# Patient Record
Sex: Female | Born: 1995 | Race: White | Hispanic: No | Marital: Single | Smoking: Never smoker
Health system: Southern US, Community
[De-identification: ages and names within clinical notes are randomized; demographics above are authoritative.]

## PROBLEM LIST (undated history)

## (undated) DIAGNOSIS — K449 Diaphragmatic hernia without obstruction or gangrene: Secondary | ICD-10-CM

## (undated) DIAGNOSIS — K219 Gastro-esophageal reflux disease without esophagitis: Secondary | ICD-10-CM

## (undated) DIAGNOSIS — F819 Developmental disorder of scholastic skills, unspecified: Secondary | ICD-10-CM

## (undated) DIAGNOSIS — F419 Anxiety disorder, unspecified: Secondary | ICD-10-CM

## (undated) HISTORY — PX: WISDOM TOOTH EXTRACTION: SHX21

---

## 2019-07-12 ENCOUNTER — Other Ambulatory Visit: Payer: Self-pay | Admitting: Nurse Practitioner

## 2019-07-12 ENCOUNTER — Other Ambulatory Visit (HOSPITAL_COMMUNITY)
Admission: RE | Admit: 2019-07-12 | Discharge: 2019-07-12 | Disposition: A | Discharge status: Home / Self Care | Payer: Self-pay | Source: Ambulatory Visit | Attending: Nurse Practitioner | Admitting: Nurse Practitioner

## 2019-07-12 DIAGNOSIS — Z01419 Encounter for gynecological examination (general) (routine) without abnormal findings: Secondary | ICD-10-CM | POA: Insufficient documentation

## 2019-07-16 LAB — CYTOLOGY - PAP: Diagnosis: NEGATIVE

## 2020-04-28 ENCOUNTER — Emergency Department (HOSPITAL_COMMUNITY)
Admission: EM | Admit: 2020-04-28 | Discharge: 2020-04-28 | Disposition: A | Discharge status: Home / Self Care | Payer: BC Managed Care – PPO | Attending: Emergency Medicine | Admitting: Emergency Medicine

## 2020-04-28 ENCOUNTER — Encounter (HOSPITAL_COMMUNITY): Payer: Self-pay

## 2020-04-28 ENCOUNTER — Other Ambulatory Visit: Payer: Self-pay

## 2020-04-28 DIAGNOSIS — Z5321 Procedure and treatment not carried out due to patient leaving prior to being seen by health care provider: Secondary | ICD-10-CM | POA: Insufficient documentation

## 2020-04-28 DIAGNOSIS — R0789 Other chest pain: Secondary | ICD-10-CM | POA: Diagnosis present

## 2020-04-28 DIAGNOSIS — R110 Nausea: Secondary | ICD-10-CM | POA: Insufficient documentation

## 2020-04-28 HISTORY — DX: Diaphragmatic hernia without obstruction or gangrene: K44.9

## 2020-04-28 NOTE — ED Triage Notes (Signed)
Arrived POV from home. Patient reports she was diagnosed with hiatal hernia and is having pain associated with it tonight. Patient reports mid-sternal chest pain that goes around to right upper back. Also reports nausea without vomiting.

## 2020-09-14 ENCOUNTER — Other Ambulatory Visit: Payer: Self-pay | Admitting: Gastroenterology

## 2020-09-14 DIAGNOSIS — K2100 Gastro-esophageal reflux disease with esophagitis, without bleeding: Secondary | ICD-10-CM

## 2020-09-14 DIAGNOSIS — R1013 Epigastric pain: Secondary | ICD-10-CM

## 2020-09-24 ENCOUNTER — Ambulatory Visit
Admission: RE | Admit: 2020-09-24 | Discharge: 2020-09-24 | Disposition: A | Discharge status: Home / Self Care | Payer: BC Managed Care – PPO | Source: Ambulatory Visit | Attending: Gastroenterology | Admitting: Gastroenterology

## 2020-09-24 DIAGNOSIS — K2100 Gastro-esophageal reflux disease with esophagitis, without bleeding: Secondary | ICD-10-CM

## 2020-09-24 DIAGNOSIS — R1013 Epigastric pain: Secondary | ICD-10-CM

## 2020-12-17 ENCOUNTER — Other Ambulatory Visit (HOSPITAL_COMMUNITY)
Admission: RE | Admit: 2020-12-17 | Discharge: 2020-12-17 | Disposition: A | Discharge status: Home / Self Care | Payer: BC Managed Care – PPO | Source: Ambulatory Visit | Attending: General Surgery | Admitting: General Surgery

## 2020-12-17 DIAGNOSIS — Z79899 Other long term (current) drug therapy: Secondary | ICD-10-CM | POA: Diagnosis not present

## 2020-12-17 DIAGNOSIS — Z01812 Encounter for preprocedural laboratory examination: Secondary | ICD-10-CM | POA: Insufficient documentation

## 2020-12-17 DIAGNOSIS — Z20822 Contact with and (suspected) exposure to covid-19: Secondary | ICD-10-CM | POA: Insufficient documentation

## 2020-12-17 DIAGNOSIS — K8080 Other cholelithiasis without obstruction: Secondary | ICD-10-CM | POA: Diagnosis present

## 2020-12-17 DIAGNOSIS — K8010 Calculus of gallbladder with chronic cholecystitis without obstruction: Secondary | ICD-10-CM | POA: Diagnosis not present

## 2020-12-18 ENCOUNTER — Other Ambulatory Visit: Payer: Self-pay

## 2020-12-18 ENCOUNTER — Encounter (HOSPITAL_COMMUNITY): Payer: Self-pay | Admitting: General Surgery

## 2020-12-18 LAB — SARS CORONAVIRUS 2 (TAT 6-24 HRS): SARS Coronavirus 2: NEGATIVE

## 2020-12-18 NOTE — Progress Notes (Addendum)
Brandi Leon denies chest pain or shortness of breath. Patient tested negative for Covid_1/3/22_ and has been in quarantine since that time.  I instructed Brandi. Henderson to stop [employer]

## 2020-12-19 ENCOUNTER — Ambulatory Visit: Payer: Self-pay | Admitting: General Surgery

## 2020-12-19 NOTE — Anesthesia Preprocedure Evaluation (Addendum)
Anesthesia Evaluation  Patient identified by MRN, date of birth, ID band Patient awake    Reviewed: Allergy & Precautions, H&P , NPO status , Patient's Chart, lab work & pertinent test results  Airway Mallampati: II  TM Distance: >3 FB Neck ROM: Full    Dental no notable dental hx.    Pulmonary neg pulmonary ROS,    breath sounds clear to auscultation       Cardiovascular Exercise Tolerance: Good negative cardio ROS Normal cardiovascular exam Rhythm:Regular Rate:Normal     Neuro/Psych PSYCHIATRIC DISORDERS Anxiety Hearing loss Learning disability    GI/Hepatic Neg liver ROS, hiatal hernia, GERD  ,  Endo/Other  Morbid obesity  Renal/GU negative Renal ROS  negative genitourinary   Musculoskeletal negative musculoskeletal ROS (+)   Abdominal (+) + obese,   Peds negative pediatric ROS (+)  Hematology negative hematology ROS (+)   Anesthesia Other Findings   Reproductive/Obstetrics negative OB ROS                            Anesthesia Physical Anesthesia Plan  ASA: III  Anesthesia Plan: General   Post-op Pain Management:    Induction: Intravenous  PONV Risk Score and Plan: 2 and Scopolamine patch - Pre-op, Midazolam, Dexamethasone and Ondansetron  Airway Management Planned: Oral ETT  Additional Equipment: None  Intra-op Plan:   Post-operative Plan:   Informed Consent: I have reviewed the patients History and Physical, chart, labs and discussed the procedure including the risks, benefits and alternatives for the proposed anesthesia with the patient or authorized representative who has indicated his/her understanding and acceptance.     Dental advisory given  Plan Discussed with: Anesthesiologist and CRNA  Anesthesia Plan Comments:        Anesthesia Quick Evaluation

## 2020-12-20 ENCOUNTER — Encounter (HOSPITAL_COMMUNITY): Payer: Self-pay | Admitting: General Surgery

## 2020-12-20 ENCOUNTER — Ambulatory Visit (HOSPITAL_COMMUNITY): Payer: BC Managed Care – PPO | Admitting: Anesthesiology

## 2020-12-20 ENCOUNTER — Encounter (HOSPITAL_COMMUNITY)
Admission: RE | Disposition: A | Discharge status: Home / Self Care | Payer: Self-pay | Source: Home / Self Care | Attending: General Surgery

## 2020-12-20 ENCOUNTER — Ambulatory Visit (HOSPITAL_COMMUNITY)
Admission: RE | Admit: 2020-12-20 | Discharge: 2020-12-20 | Disposition: A | Discharge status: Home / Self Care | Payer: BC Managed Care – PPO | Attending: General Surgery | Admitting: General Surgery

## 2020-12-20 ENCOUNTER — Other Ambulatory Visit: Payer: Self-pay

## 2020-12-20 DIAGNOSIS — Z79899 Other long term (current) drug therapy: Secondary | ICD-10-CM | POA: Insufficient documentation

## 2020-12-20 DIAGNOSIS — K8010 Calculus of gallbladder with chronic cholecystitis without obstruction: Secondary | ICD-10-CM | POA: Insufficient documentation

## 2020-12-20 DIAGNOSIS — Z20822 Contact with and (suspected) exposure to covid-19: Secondary | ICD-10-CM | POA: Insufficient documentation

## 2020-12-20 HISTORY — PX: CHOLECYSTECTOMY: SHX55

## 2020-12-20 HISTORY — DX: Developmental disorder of scholastic skills, unspecified: F81.9

## 2020-12-20 HISTORY — DX: Gastro-esophageal reflux disease without esophagitis: K21.9

## 2020-12-20 HISTORY — DX: Anxiety disorder, unspecified: F41.9

## 2020-12-20 LAB — CBC
HCT: 43.1 % (ref 36.0–46.0)
Hemoglobin: 14.3 g/dL (ref 12.0–15.0)
MCH: 28.9 pg (ref 26.0–34.0)
MCHC: 33.2 g/dL (ref 30.0–36.0)
MCV: 87.2 fL (ref 80.0–100.0)
Platelets: 397 10*3/uL (ref 150–400)
RBC: 4.94 MIL/uL (ref 3.87–5.11)
RDW: 13.5 % (ref 11.5–15.5)
WBC: 10.2 10*3/uL (ref 4.0–10.5)
nRBC: 0 % (ref 0.0–0.2)

## 2020-12-20 LAB — POCT PREGNANCY, URINE: Preg Test, Ur: NEGATIVE

## 2020-12-20 SURGERY — LAPAROSCOPIC CHOLECYSTECTOMY
Anesthesia: General | Site: Abdomen

## 2020-12-20 MED ORDER — FENTANYL CITRATE (PF) 100 MCG/2ML IJ SOLN
25.0000 ug | INTRAMUSCULAR | Status: DC | PRN
  Administered 2020-12-20 (×4): 25 ug via INTRAVENOUS

## 2020-12-20 MED ORDER — BUPIVACAINE HCL (PF) 0.25 % IJ SOLN
INTRAMUSCULAR | Status: AC
  Filled 2020-12-20: qty 30

## 2020-12-20 MED ORDER — ONDANSETRON HCL 4 MG/2ML IJ SOLN
INTRAMUSCULAR | Status: AC
  Filled 2020-12-20: qty 2

## 2020-12-20 MED ORDER — ONDANSETRON HCL 4 MG/2ML IJ SOLN
INTRAMUSCULAR | Status: DC | PRN
  Administered 2020-12-20: 4 mg via INTRAVENOUS

## 2020-12-20 MED ORDER — CHLORHEXIDINE GLUCONATE CLOTH 2 % EX PADS: 6.0000 | MEDICATED_PAD | Freq: Once | CUTANEOUS | Status: DC

## 2020-12-20 MED ORDER — ROCURONIUM BROMIDE 10 MG/ML (PF) SYRINGE
PREFILLED_SYRINGE | INTRAVENOUS | Status: DC | PRN
  Administered 2020-12-20: 80 mg via INTRAVENOUS

## 2020-12-20 MED ORDER — LACTATED RINGERS IV SOLN: INTRAVENOUS | Status: DC

## 2020-12-20 MED ORDER — AMISULPRIDE (ANTIEMETIC) 5 MG/2ML IV SOLN: 10.0000 mg | Freq: Once | INTRAVENOUS | Status: DC | PRN

## 2020-12-20 MED ORDER — ENSURE PRE-SURGERY PO LIQD: 296.0000 mL | Freq: Once | ORAL | Status: DC

## 2020-12-20 MED ORDER — SUGAMMADEX SODIUM 200 MG/2ML IV SOLN
INTRAVENOUS | Status: DC | PRN
  Administered 2020-12-20: 400 mg via INTRAVENOUS

## 2020-12-20 MED ORDER — OXYCODONE HCL 5 MG/5ML PO SOLN: 5.0000 mg | Freq: Once | ORAL | Status: DC | PRN

## 2020-12-20 MED ORDER — LIDOCAINE 2% (20 MG/ML) 5 ML SYRINGE
INTRAMUSCULAR | Status: AC
  Filled 2020-12-20: qty 5

## 2020-12-20 MED ORDER — MIDAZOLAM HCL 2 MG/2ML IJ SOLN
INTRAMUSCULAR | Status: AC
  Filled 2020-12-20: qty 2

## 2020-12-20 MED ORDER — SCOPOLAMINE 1 MG/3DAYS TD PT72
1.0000 | MEDICATED_PATCH | TRANSDERMAL | Status: DC
  Administered 2020-12-20: 1.5 mg via TRANSDERMAL
  Filled 2020-12-20: qty 1

## 2020-12-20 MED ORDER — LIDOCAINE 2% (20 MG/ML) 5 ML SYRINGE
INTRAMUSCULAR | Status: DC | PRN
  Administered 2020-12-20: 80 mg via INTRAVENOUS

## 2020-12-20 MED ORDER — OXYCODONE HCL 5 MG PO TABS: 5.0000 mg | ORAL_TABLET | Freq: Once | ORAL | Status: DC | PRN

## 2020-12-20 MED ORDER — MIDAZOLAM HCL 2 MG/2ML IJ SOLN
INTRAMUSCULAR | Status: DC | PRN
  Administered 2020-12-20: 2 mg via INTRAVENOUS

## 2020-12-20 MED ORDER — PROMETHAZINE HCL 25 MG/ML IJ SOLN: 6.2500 mg | INTRAMUSCULAR | Status: DC | PRN

## 2020-12-20 MED ORDER — DEXAMETHASONE SODIUM PHOSPHATE 10 MG/ML IJ SOLN
INTRAMUSCULAR | Status: DC | PRN
  Administered 2020-12-20: 10 mg via INTRAVENOUS

## 2020-12-20 MED ORDER — CHLORHEXIDINE GLUCONATE 0.12 % MT SOLN
15.0000 mL | Freq: Once | OROMUCOSAL | Status: AC
  Administered 2020-12-20: 15 mL via OROMUCOSAL
  Filled 2020-12-20: qty 15

## 2020-12-20 MED ORDER — PHENYLEPHRINE 40 MCG/ML (10ML) SYRINGE FOR IV PUSH (FOR BLOOD PRESSURE SUPPORT)
PREFILLED_SYRINGE | INTRAVENOUS | Status: DC | PRN
  Administered 2020-12-20: 80 ug via INTRAVENOUS

## 2020-12-20 MED ORDER — BUPIVACAINE HCL 0.25 % IJ SOLN
INTRAMUSCULAR | Status: DC | PRN
  Administered 2020-12-20: 16 mL

## 2020-12-20 MED ORDER — PROPOFOL 10 MG/ML IV BOLUS
INTRAVENOUS | Status: DC | PRN
  Administered 2020-12-20: 150 mg via INTRAVENOUS

## 2020-12-20 MED ORDER — 0.9 % SODIUM CHLORIDE (POUR BTL) OPTIME
TOPICAL | Status: DC | PRN
  Administered 2020-12-20: 1000 mL

## 2020-12-20 MED ORDER — ROCURONIUM BROMIDE 10 MG/ML (PF) SYRINGE
PREFILLED_SYRINGE | INTRAVENOUS | Status: AC
  Filled 2020-12-20: qty 10

## 2020-12-20 MED ORDER — ACETAMINOPHEN 500 MG PO TABS
1000.0000 mg | ORAL_TABLET | ORAL | Status: AC
  Administered 2020-12-20: 1000 mg via ORAL
  Filled 2020-12-20: qty 2

## 2020-12-20 MED ORDER — ORAL CARE MOUTH RINSE: 15.0000 mL | Freq: Once | OROMUCOSAL | Status: AC

## 2020-12-20 MED ORDER — FENTANYL CITRATE (PF) 250 MCG/5ML IJ SOLN
INTRAMUSCULAR | Status: AC
  Filled 2020-12-20: qty 5

## 2020-12-20 MED ORDER — CEFAZOLIN SODIUM-DEXTROSE 2-4 GM/100ML-% IV SOLN
2.0000 g | INTRAVENOUS | Status: AC
  Administered 2020-12-20: 2 g via INTRAVENOUS
  Filled 2020-12-20: qty 100

## 2020-12-20 MED ORDER — DEXAMETHASONE SODIUM PHOSPHATE 10 MG/ML IJ SOLN
INTRAMUSCULAR | Status: AC
  Filled 2020-12-20: qty 1

## 2020-12-20 MED ORDER — FENTANYL CITRATE (PF) 250 MCG/5ML IJ SOLN
INTRAMUSCULAR | Status: DC | PRN
  Administered 2020-12-20: 25 ug via INTRAVENOUS
  Administered 2020-12-20 (×3): 50 ug via INTRAVENOUS

## 2020-12-20 MED ORDER — SODIUM CHLORIDE 0.9 % IR SOLN
Status: DC | PRN
  Administered 2020-12-20: 1000 mL

## 2020-12-20 MED ORDER — PROPOFOL 10 MG/ML IV BOLUS
INTRAVENOUS | Status: AC
  Filled 2020-12-20: qty 20

## 2020-12-20 MED ORDER — TRAMADOL HCL 50 MG PO TABS: 50.0000 mg | ORAL_TABLET | Freq: Four times a day (QID) | ORAL | 0 refills | Status: AC | PRN

## 2020-12-20 MED ORDER — FENTANYL CITRATE (PF) 100 MCG/2ML IJ SOLN
INTRAMUSCULAR | Status: AC
  Filled 2020-12-20: qty 2

## 2020-12-20 SURGICAL SUPPLY — 38 items
CANISTER SUCT 3000ML PPV (MISCELLANEOUS) ×2 IMPLANT
CHLORAPREP W/TINT 26 (MISCELLANEOUS) ×2 IMPLANT
CLIP VESOLOCK MED LG 6/CT (CLIP) ×2 IMPLANT
COVER SURGICAL LIGHT HANDLE (MISCELLANEOUS) ×2 IMPLANT
COVER TRANSDUCER ULTRASND (DRAPES) ×2 IMPLANT
COVER WAND RF STERILE (DRAPES) ×2 IMPLANT
DERMABOND ADVANCED (GAUZE/BANDAGES/DRESSINGS) ×1
DERMABOND ADVANCED .7 DNX12 (GAUZE/BANDAGES/DRESSINGS) ×1 IMPLANT
ELECT REM PT RETURN 9FT ADLT (ELECTROSURGICAL) ×2
ELECTRODE REM PT RTRN 9FT ADLT (ELECTROSURGICAL) ×1 IMPLANT
GLOVE BIO SURGEON STRL SZ7.5 (GLOVE) ×2 IMPLANT
GOWN STRL REUS W/ TWL LRG LVL3 (GOWN DISPOSABLE) ×2 IMPLANT
GOWN STRL REUS W/ TWL XL LVL3 (GOWN DISPOSABLE) ×1 IMPLANT
GOWN STRL REUS W/TWL LRG LVL3 (GOWN DISPOSABLE) ×2
GOWN STRL REUS W/TWL XL LVL3 (GOWN DISPOSABLE) ×1
GRASPER SUT TROCAR 14GX15 (MISCELLANEOUS) ×2 IMPLANT
KIT BASIN OR (CUSTOM PROCEDURE TRAY) ×2 IMPLANT
KIT TURNOVER KIT B (KITS) ×2 IMPLANT
NEEDLE INSUFFLATION 14GA 120MM (NEEDLE) ×2 IMPLANT
NS IRRIG 1000ML POUR BTL (IV SOLUTION) ×2 IMPLANT
PAD ARMBOARD 7.5X6 YLW CONV (MISCELLANEOUS) ×2 IMPLANT
POUCH LAPAROSCOPIC INSTRUMENT (MISCELLANEOUS) ×2 IMPLANT
POUCH RETRIEVAL ECOSAC 10 (ENDOMECHANICALS) IMPLANT
POUCH RETRIEVAL ECOSAC 10MM (ENDOMECHANICALS)
SCISSORS LAP 5X35 DISP (ENDOMECHANICALS) ×2 IMPLANT
SET IRRIG TUBING LAPAROSCOPIC (IRRIGATION / IRRIGATOR) ×2 IMPLANT
SET TUBE SMOKE EVAC HIGH FLOW (TUBING) ×2 IMPLANT
SLEEVE ENDOPATH XCEL 5M (ENDOMECHANICALS) ×4 IMPLANT
SLEEVE XCEL OPT CAN 5 100 (ENDOMECHANICALS) IMPLANT
SPECIMEN JAR SMALL (MISCELLANEOUS) ×2 IMPLANT
SUT MNCRL AB 4-0 PS2 18 (SUTURE) ×2 IMPLANT
TOWEL GREEN STERILE (TOWEL DISPOSABLE) ×2 IMPLANT
TOWEL GREEN STERILE FF (TOWEL DISPOSABLE) ×2 IMPLANT
TRAY LAPAROSCOPIC MC (CUSTOM PROCEDURE TRAY) ×2 IMPLANT
TROCAR XCEL NON-BLD 11X100MML (ENDOMECHANICALS) ×2 IMPLANT
TROCAR XCEL NON-BLD 5MMX100MML (ENDOMECHANICALS) ×2 IMPLANT
WARMER LAPAROSCOPE (MISCELLANEOUS) ×2 IMPLANT
WATER STERILE IRR 1000ML POUR (IV SOLUTION) ×2 IMPLANT

## 2020-12-20 NOTE — Op Note (Signed)
12/20/2020  8:14 AM  PATIENT:  Brandi Leon  25 y.o. female  PRE-OPERATIVE DIAGNOSIS:  GALLSTONES  POST-OPERATIVE DIAGNOSIS:  GALLSTONES  PROCEDURE:  Procedure(s): LAPAROSCOPIC CHOLECYSTECTOMY (N/A)  SURGEON:  Surgeon(s) and Role:    Axel Filler, MD - Primary  ASSISTANTS: Berenda Morale, RNFA   ANESTHESIA:   local and general  EBL:  minimal   BLOOD ADMINISTERED:none  DRAINS: none   LOCAL MEDICATIONS USED:  BUPIVICAINE   SPECIMEN:  Source of Specimen:  gallbladder  DISPOSITION OF SPECIMEN:  N/A  COUNTS:  YES  TOURNIQUET:  * No tourniquets in log *  DICTATION: .Dragon Dictation The patient was taken to the operating and placed in the supine position with bilateral SCDs in place.  The patient was prepped and draped in the usual sterile fashion. A time out was called and all facts were verified. A pneumoperitoneum was obtained via A Veress needle technique to a pressure of 15mm of mercury.  A 67mm trochar was then placed in the right upper quadrant under visualization, and there were no injuries to any abdominal organs. A 11 mm port was then placed in the umbilical region after infiltrating with local anesthesia under direct visualization. A second and third epigastric port and right lower quadrant port placement under direct visualization, respectively.    The gallbladder was identified and retracted, the peritoneum was then sharply dissected from the gallbladder and this dissection was carried down to Calot's triangle. The gallbladder was identified and stripped away circumferentially and seen going into the gallbladder 360, the critical angle was obtained.  2 clips were placed proximally one distally and the cystic duct transected. The cystic artery was identified and 2 clips placed proximally and one distally and transected.  We then proceeded to remove the gallbladder off the hepatic fossa with Bovie cautery. A retrieval bag was then placed in the abdomen and  gallbladder placed in the bag. The hepatic fossa was then reexamined and hemostasis was achieved with Bovie cautery and was excellent at the end of the case.   The subhepatic fossa and perihepatic fossa was then irrigated until the effluent was clear.  The gallbladder and bag were removed from the abdominal cavity. The 11 mm trocar fascia was reapproximated with the Endo Close #1 Vicryl x2.  The pneumoperitoneum was evacuated and all trochars removed under direct visulalization.  The skin was then closed with 4-0 Monocryl and the skin dressed with Dermabond.    The patient was awaken from general anesthesia and taken to the recovery room in stable condition.   PLAN OF CARE: Discharge to home after PACU  PATIENT DISPOSITION:  PACU - hemodynamically stable.   Delay start of Pharmacological VTE agent (>24hrs) due to surgical blood loss or risk of bleeding: not applicable

## 2020-12-20 NOTE — Transfer of Care (Signed)
Immediate Anesthesia Transfer of Care Note  Patient: Brandi Leon  Procedure(s) Performed: LAPAROSCOPIC CHOLECYSTECTOMY (N/A Abdomen)  Patient Location: PACU  Anesthesia Type:General  Level of Consciousness: awake, alert  and oriented  Airway & Oxygen Therapy: Patient Spontanous Breathing and Patient connected to nasal cannula oxygen  Post-op Assessment: Report given to RN and Post -op Vital signs reviewed and stable  Post vital signs: Reviewed and stable  Last Vitals:  Vitals Value Taken Time  BP 138/109 12/20/20 0835  Temp 36.7 C 12/20/20 0835  Pulse 71 12/20/20 0844  Resp 16 12/20/20 0844  SpO2 100 % 12/20/20 0844  Vitals shown include unvalidated device data.  Last Pain:  Vitals:   12/20/20 0835  TempSrc:   PainSc: 10-Worst pain ever      Patients Stated Pain Goal: 0 (12/20/20 9622)  Complications: No complications documented.

## 2020-12-20 NOTE — Anesthesia Postprocedure Evaluation (Signed)
Anesthesia Post Note  Patient: [name]  Procedure(s) Performed: LAPAROSCOPIC CHOLECYSTECTOMY (N/A Abdomen)     Patient location during evaluation: PACU Anesthesia Type: General Level of consciousness: awake Pain management: pain level controlled Vital Signs Assessment: post-procedure vital signs reviewed and stable Respiratory status: spontaneous breathing and respiratory function stable Cardiovascular status: stable Postop Assessment: no apparent nausea or vomiting Anesthetic complications: no   No complications documented.  Last Vitals:  Vitals:   12/20/20 0601 12/20/20 0835  BP: 139/84   Pulse: 96   Resp: 18   Temp: 36.8 C 36.7 C  SpO2: 96%     Last Pain:  Vitals:   12/20/20 0835  TempSrc:   PainSc: 10-Worst pain ever                 Mellody Dance

## 2020-12-20 NOTE — Anesthesia Procedure Notes (Signed)
Procedure Name: Intubation Date/Time: 12/20/2020 7:33 AM Performed by: Lelon Perla, CRNA Pre-anesthesia Checklist: Patient identified, Emergency Drugs available, Suction available and Patient being monitored Patient Re-evaluated:Patient Re-evaluated prior to induction Oxygen Delivery Method: Circle system utilized Preoxygenation: Pre-oxygenation with 100% oxygen Induction Type: IV induction Ventilation: Mask ventilation without difficulty Laryngoscope Size: Glidescope and 3 Grade View: Grade I Tube type: Oral Tube size: 7.0 mm Number of attempts: 1 Airway Equipment and Method: Stylet and Oral airway Placement Confirmation: ETT inserted through vocal cords under direct vision,  positive ETCO2 and breath sounds checked- equal and bilateral Secured at: 21 cm Tube secured with: Tape Dental Injury: Teeth and Oropharynx as per pre-operative assessment

## 2020-12-20 NOTE — H&P (Signed)
  History of Present Illness  The patient is a 25 year old female who presents for evaluation of gall stones. Referred by: Dr. Marca Ancona Chief Complaint: Right upper quadrant pain  Patient is a 25 year old female, with history of reflux, morbid obesity, comes in secondary to right upper quadrant pain. States this has been going on for possibly 6 weeks. She states that she does have right upper quadrant pain that radiates to the back. She states that there is no associated nausea or vomiting. Patient has had significant reflux issues. Patient's at this worked up with endoscopy. This is revealed a 2 cm hiatal hernia as well as Schlotzsky's ring. Patient recently underwent ultrasound which revealed a gallbladder with multiple gallstones and a 1.2 cm gallstone.  Patient's had no previous abdominal surgery     Past Surgical History  No pertinent past surgical history   Diagnostic Studies History  Pap Smear  1-5 years ago  Allergies  No Known Drug Allergies  [09/28/2020]:  Medication History  Pantoprazole Sodium (40MG  Tablet DR, Oral) Active. Medications Reconciled  Social History  Alcohol use  Moderate alcohol use. Caffeine use  Carbonated beverages, Tea. No drug use  Tobacco use  Never smoker.  Family History  Hypertension  Mother. Thyroid problems  Father.  Pregnancy / Birth History  Age at menarche  16 years. Gravida  0 Irregular periods  Para  0  Other Problems  Anxiety Disorder  Gastroesophageal Reflux Disease  Other disease, cancer, significant illness  Thyroid Disease     Review of Systems  General Present- Feeling well. Not Present- Fever. Respiratory Not Present- Cough and Difficulty Breathing. Cardiovascular Not Present- Chest Pain. Gastrointestinal Present- Abdominal Pain, Change in Bowel Habits, Excessive gas and Indigestion. Not Present- Bloating, Bloody Stool, Chronic diarrhea, Constipation, Difficulty Swallowing, Gets full quickly  at meals, Hemorrhoids, Nausea, Rectal Pain and Vomiting. Musculoskeletal Not Present- Myalgia. Neurological Not Present- Weakness. All other systems negative  BP 139/84   Pulse 96   Temp 98.2 F (36.8 C) (Oral)   Resp 18   Ht 5\' 1"  (1.549 m)   Wt 108.9 kg   LMP 09/17/2020   SpO2 96%   BMI 45.35 kg/m      Physical Exam  The physical exam findings are as follows: Note: Constitutional: No acute distress, conversant, appears stated age  Eyes: Anicteric sclerae, moist conjunctiva, no lid lag  Neck: No thyromegaly, trachea midline, no cervical lymphadenopathy  Lungs: Clear to auscultation biilaterally, normal respiratory effot  Cardiovascular: regular rate & rhythm, no murmurs, no peripheal edema, pedal pulses 2+  GI: Soft, no masses or hepatosplenomegaly, non-tender to palpation  MSK: Normal gait, no clubbing cyanosis, edema  Skin: No rashes, palpation reveals normal skin turgor  Psychiatric: Appropriate judgment and insight, oriented to person, place, and time    Assessment & Plan  SYMPTOMATIC CHOLELITHIASIS (K80.20) Impression: 25 year old female with symptomatic cholelithiasis 1. We will proceed to the operating room for a laparoscopic cholecystectomy  2. Risks and benefits were discussed with the patient to generally include, but not limited to: infection, bleeding, possible need for post op ERCP, damage to the bile ducts, bile leak, and possible need for further surgery. Alternatives were offered and described. All questions were answered and the patient voiced understanding of the procedure and wishes to proceed at this point with a laparoscopic cholecystectomy

## 2020-12-20 NOTE — Discharge Instructions (Signed)
[facility] ______[facility] °LAPAROSCOPIC SURGERY: POST OP INSTRUCTIONS °Always review your discharge instruction sheet given to you by the facility where your surgery was performed. °IF YOU HAVE DISABILITY OR FAMILY LEAVE FORMS, YOU MUST BRING THEM TO THE OFFICE FOR PROCESSING.   °DO NOT GIVE THEM TO YOUR DOCTOR. ° °1. A prescription for pain medication may be given to you upon discharge.  Take your pain medication as prescribed, if needed.  If narcotic pain medicine is not needed, then you may take acetaminophen (Tylenol) or ibuprofen (Advil) as needed. °2. Take your usually prescribed medications unless otherwise directed. °3. If you need a refill on your pain medication, please contact your pharmacy.  They will contact our office to request authorization. Prescriptions will not be filled after 5pm or on week-ends. °4. You should follow a light diet the first few days after arrival home, such as soup and crackers, etc.  Be sure to include lots of fluids daily. °5. Most patients will experience some swelling and bruising in the area of the incisions.  Ice packs will help.  Swelling and bruising can take several days to resolve.  °6. It is common to experience some constipation if taking pain medication after surgery.  Increasing fluid intake and taking a stool softener (such as Colace) will usually help or prevent this problem from occurring.  A mild laxative (Milk of Magnesia or Miralax) should be taken according to package instructions if there are no bowel movements after 48 hours. °7. Unless discharge instructions indicate otherwise, you may remove your bandages 24-48 hours after surgery, and you may shower at that time.  You may have steri-strips (small skin tapes) in place directly over the incision.  These strips should be left on the skin for 7-10 days.  If your surgeon used skin glue on the incision, you may shower in 24 hours.  The glue will flake off over the next 2-3 weeks.  Any sutures or  staples will be removed at the office during your follow-up visit. °8. ACTIVITIES:  You may resume regular (light) daily activities beginning the next day--such as daily self-care, walking, climbing stairs--gradually increasing activities as tolerated.  You may have sexual intercourse when it is comfortable.  Refrain from any heavy lifting or straining until approved by your doctor. °a. You may drive when you are no longer taking prescription pain medication, you can comfortably wear a seatbelt, and you can safely maneuver your car and apply brakes. °b. RETURN TO WORK:  __________________________________________________________ °9. You should see your doctor in the office for a follow-up appointment approximately 2-3 weeks after your surgery.  Make sure that you call for this appointment within a day or two after you arrive home to insure a convenient appointment time. °10. OTHER INSTRUCTIONS: __________________________________________________________________________________________________________________________ __________________________________________________________________________________________________________________________ °WHEN TO CALL YOUR DOCTOR: °1. Fever over 101.0 °2. Inability to urinate °3. Continued bleeding from incision. °4. Increased pain, redness, or drainage from the incision. °5. Increasing abdominal pain ° °The clinic staff is available to answer your questions during regular business hours.  Please don’t hesitate to call and ask to speak to one of the nurses for clinical concerns.  If you have a medical emergency, go to the nearest emergency room or call 911.  A surgeon from [facility] is always on call at the hospital. °[address], [address], [place], [place]  [address] ? [address] [address], [place], [place]   [address] °[phone] ? [phone] ? FAX [phone] °Web site:   [email or web address] °

## 2020-12-21 ENCOUNTER — Encounter (HOSPITAL_COMMUNITY): Payer: Self-pay | Admitting: General Surgery

## 2020-12-21 LAB — SURGICAL PATHOLOGY

## 2022-03-11 IMAGING — US US ABDOMEN LIMITED
1 series · 14 of 25 positions shown · non-contrast
Comparison: None.

CLINICAL DATA: Epigastric pain postprandial for 2 weeks

EXAM:
ULTRASOUND ABDOMEN LIMITED RIGHT UPPER QUADRANT

[Series 1: us abdomen limited · 0.30mm/px · 14 of 41 slices shown]
[im 1/41]
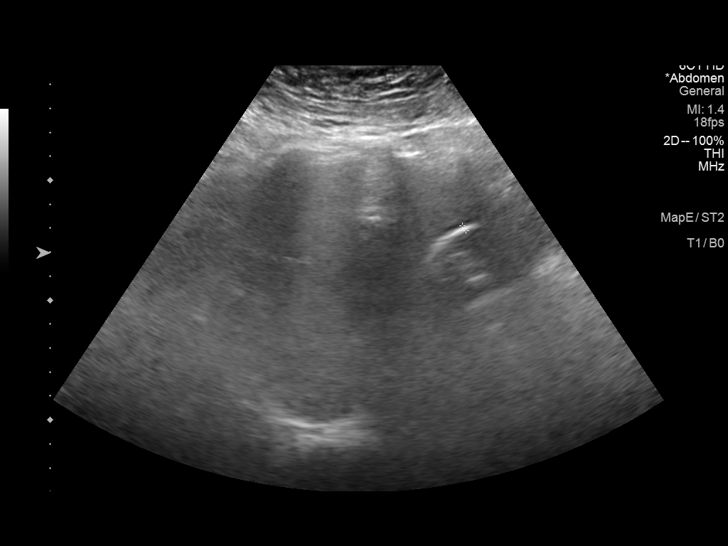
[im 4/41]
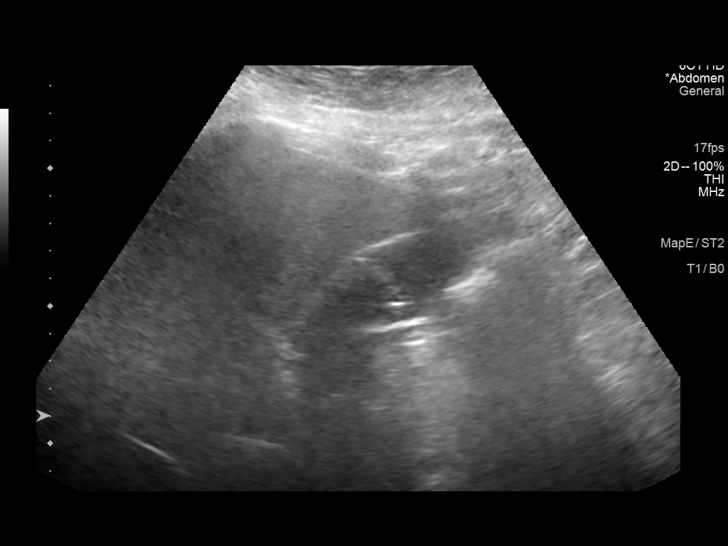
[im 7/41]
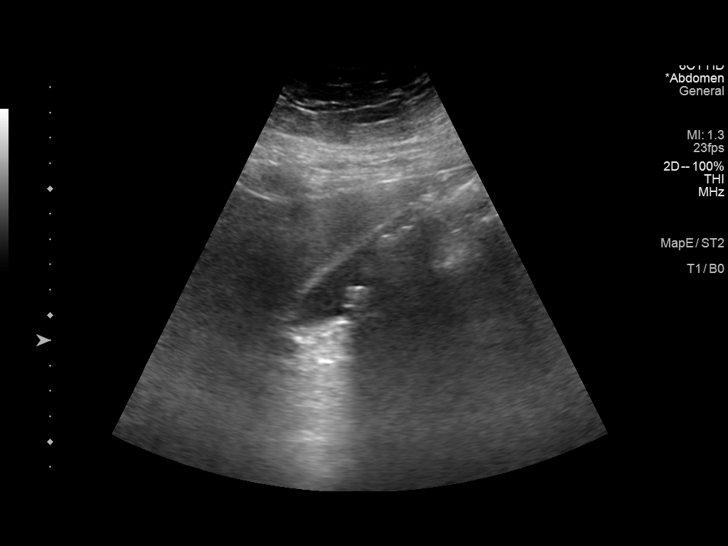
[im 11/41]
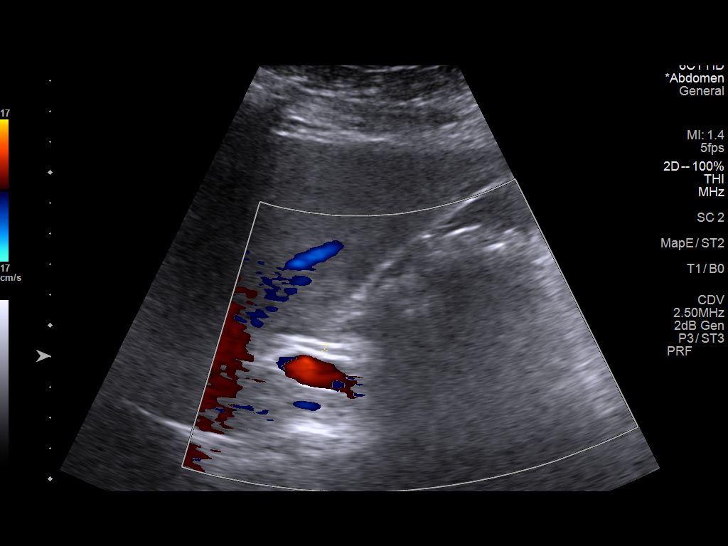
[im 14/41]
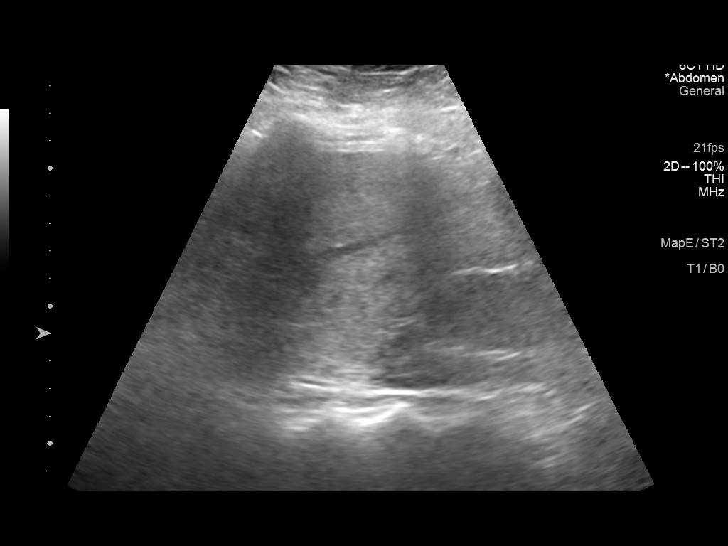
[im 16/41]
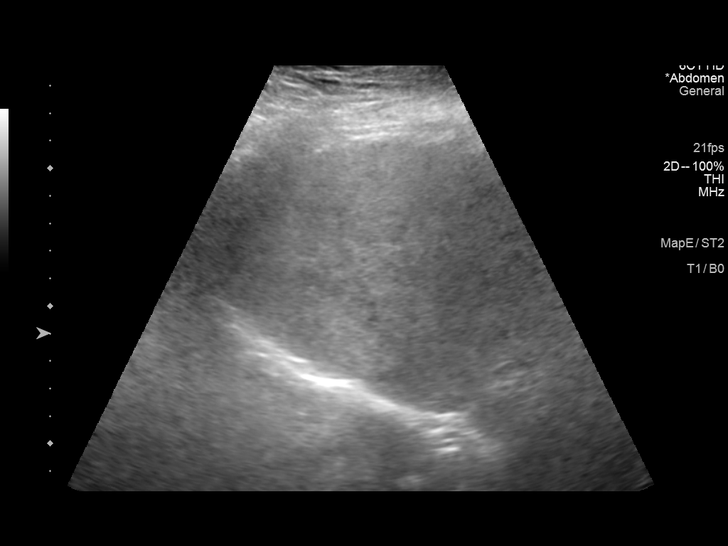
[im 19/41]
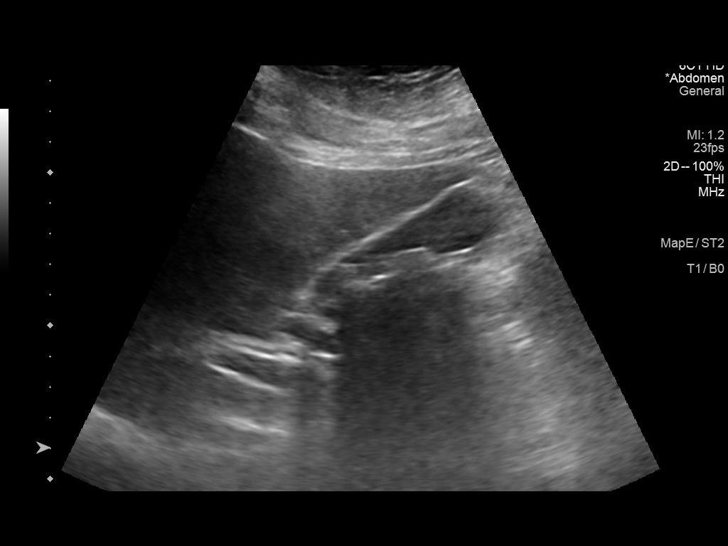
[im 22/41]
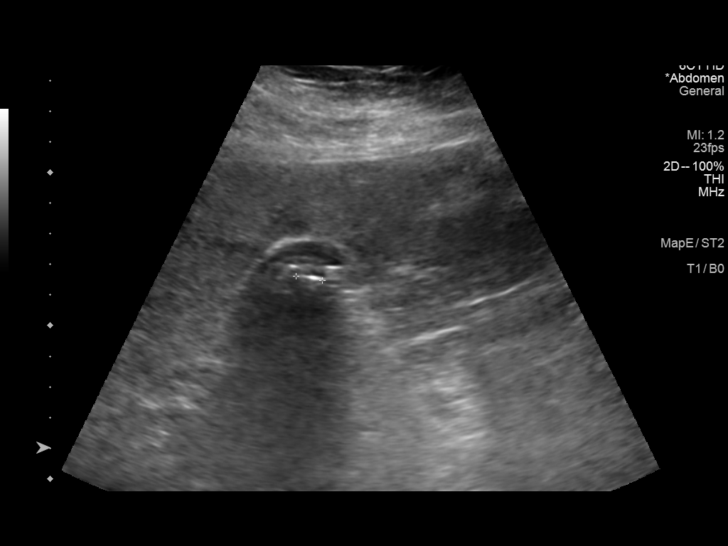
[im 26/41]
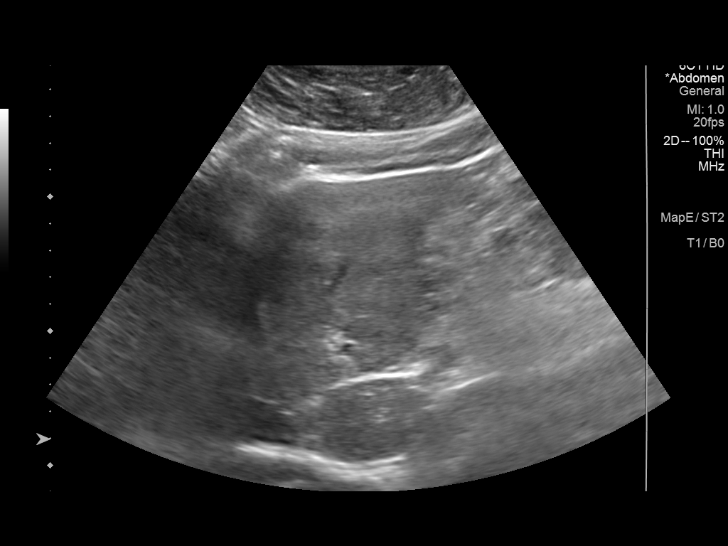
[im 27/41]
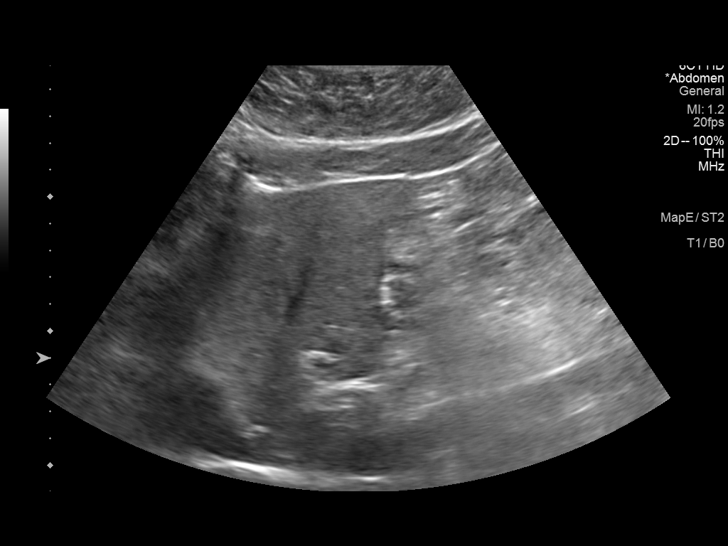
[im 31/41]
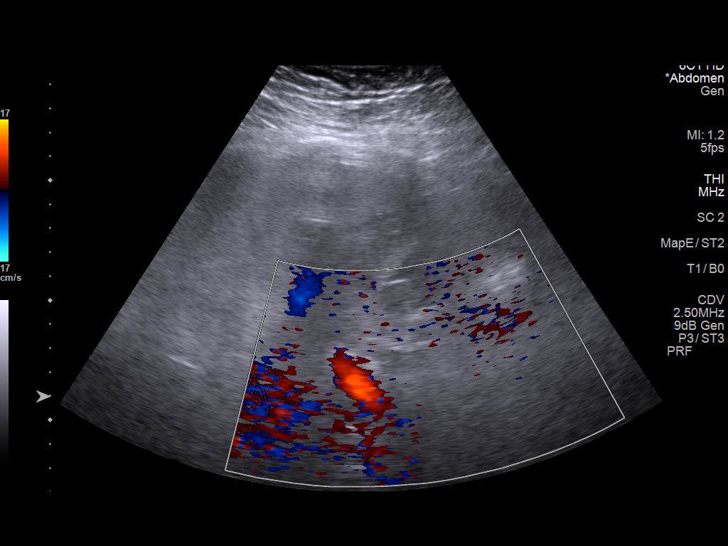
[im 34/41]
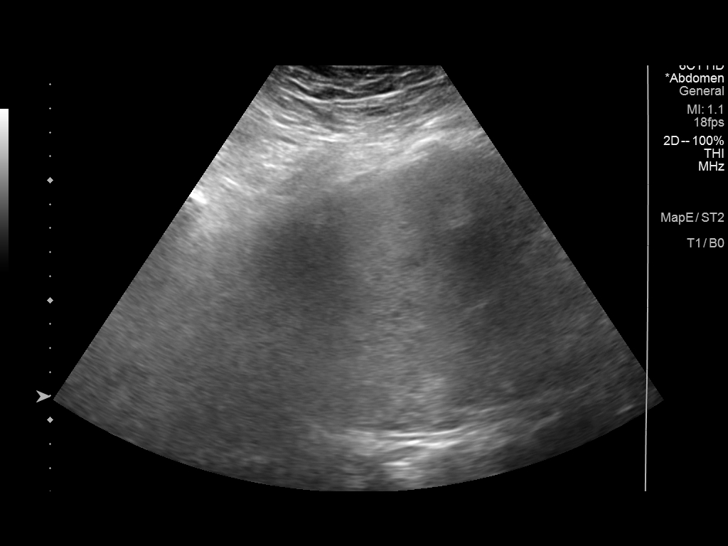
[im 37/41]
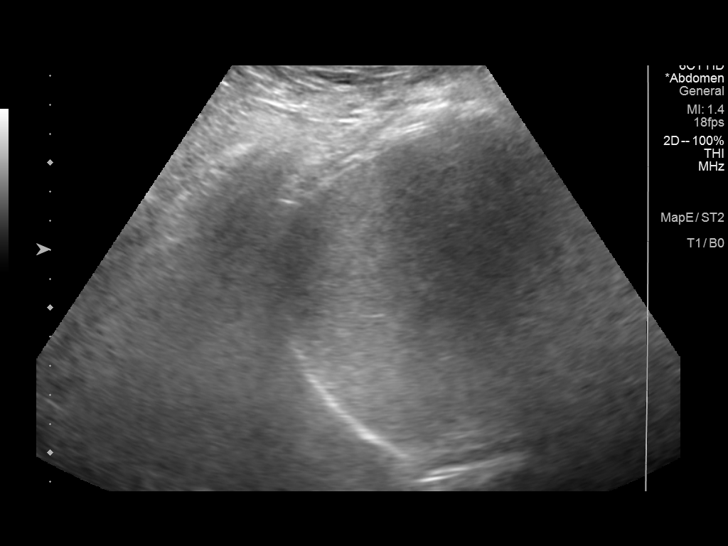
[im 41/41]
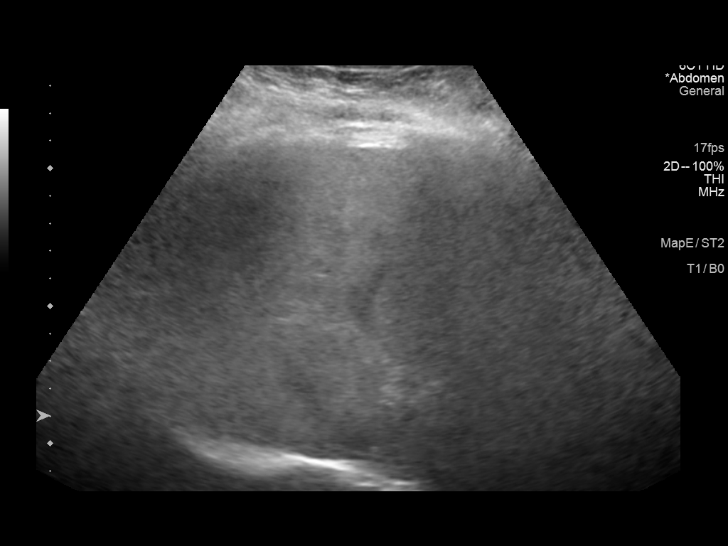

[14 of 25 positions shown; findings below may reference images not displayed]

FINDINGS: Gallbladder:

No gallbladder wall thickening. No reported tenderness over the
gallbladder. No pericholecystic fluid. Numerous gallstones in the
gallbladder lumen possibly mixed with sludge largest 15.2 mm.

Common bile duct:

Diameter: 1.9 mm

Liver:

Echogenic hepatic parenchyma with mild coarsened echotexture. Very
limited assessment due to body habitus and presumed steatosis.
Portal vein is patent on color Doppler imaging with normal direction
of blood flow towards the liver.

Other: None.
IMPRESSION: Cholelithiasis without other findings to suggest acute
cholecystitis. There are numerous gallstones and sludge in the lumen
which limits assessment of posterior wall. If there is continued
clinical suspicion CT or MRI could be utilized for further
assessment as warranted.

Hepatic steatosis. Limited assessment of the liver due to body
habitus and steatotic parenchyma.

## 2023-07-03 ENCOUNTER — Encounter (HOSPITAL_COMMUNITY): Payer: Self-pay

## 2023-07-03 ENCOUNTER — Other Ambulatory Visit: Payer: Self-pay

## 2023-07-03 ENCOUNTER — Emergency Department (HOSPITAL_COMMUNITY)
Admission: EM | Admit: 2023-07-03 | Discharge: 2023-07-03 | Disposition: A | Discharge status: Home / Self Care | Payer: Medicaid Other | Attending: Emergency Medicine | Admitting: Emergency Medicine

## 2023-07-03 ENCOUNTER — Emergency Department (HOSPITAL_COMMUNITY): Payer: Medicaid Other

## 2023-07-03 DIAGNOSIS — R1084 Generalized abdominal pain: Secondary | ICD-10-CM | POA: Diagnosis present

## 2023-07-03 DIAGNOSIS — M21932 Unspecified acquired deformity of left forearm: Secondary | ICD-10-CM | POA: Diagnosis not present

## 2023-07-03 DIAGNOSIS — Y92410 Unspecified street and highway as the place of occurrence of the external cause: Secondary | ICD-10-CM | POA: Insufficient documentation

## 2023-07-03 LAB — CBC WITH DIFFERENTIAL/PLATELET
Abs Immature Granulocytes: 0.05 10*3/uL (ref 0.00–0.07)
Basophils Absolute: 0 10*3/uL (ref 0.0–0.1)
Basophils Relative: 0 %
Eosinophils Absolute: 0.1 10*3/uL (ref 0.0–0.5)
Eosinophils Relative: 1 %
HCT: 46.4 % — ABNORMAL HIGH (ref 36.0–46.0)
Hemoglobin: 15.4 g/dL — ABNORMAL HIGH (ref 12.0–15.0)
Immature Granulocytes: 0 %
Lymphocytes Relative: 11 %
Lymphs Abs: 1.5 10*3/uL (ref 0.7–4.0)
MCH: 28.8 pg (ref 26.0–34.0)
MCHC: 33.2 g/dL (ref 30.0–36.0)
MCV: 86.9 fL (ref 80.0–100.0)
Monocytes Absolute: 0.7 10*3/uL (ref 0.1–1.0)
Monocytes Relative: 5 %
Neutro Abs: 10.7 10*3/uL — ABNORMAL HIGH (ref 1.7–7.7)
Neutrophils Relative %: 83 %
Platelets: 461 10*3/uL — ABNORMAL HIGH (ref 150–400)
RBC: 5.34 MIL/uL — ABNORMAL HIGH (ref 3.87–5.11)
RDW: 13.6 % (ref 11.5–15.5)
WBC: 13 10*3/uL — ABNORMAL HIGH (ref 4.0–10.5)
nRBC: 0 % (ref 0.0–0.2)

## 2023-07-03 LAB — URINALYSIS, ROUTINE W REFLEX MICROSCOPIC
Bacteria, UA: NONE SEEN
Bilirubin Urine: NEGATIVE
Glucose, UA: NEGATIVE mg/dL
Hgb urine dipstick: NEGATIVE
Ketones, ur: NEGATIVE mg/dL
Leukocytes,Ua: NEGATIVE
Nitrite: NEGATIVE
Protein, ur: 30 mg/dL — AB
Specific Gravity, Urine: 1.025 (ref 1.005–1.030)
pH: 6 (ref 5.0–8.0)

## 2023-07-03 LAB — COMPREHENSIVE METABOLIC PANEL
ALT: 42 U/L (ref 0–44)
AST: 28 U/L (ref 15–41)
Albumin: 4.1 g/dL (ref 3.5–5.0)
Alkaline Phosphatase: 61 U/L (ref 38–126)
Anion gap: 8 (ref 5–15)
BUN: 11 mg/dL (ref 6–20)
CO2: 23 mmol/L (ref 22–32)
Calcium: 9.6 mg/dL (ref 8.9–10.3)
Chloride: 107 mmol/L (ref 98–111)
Creatinine, Ser: 0.79 mg/dL (ref 0.44–1.00)
GFR, Estimated: >60 mL/min (ref >60)
Glucose, Bld: 113 mg/dL — ABNORMAL HIGH (ref 70–99)
Potassium: 4.3 mmol/L (ref 3.5–5.1)
Sodium: 138 mmol/L (ref 135–145)
Total Bilirubin: 0.4 mg/dL (ref 0.3–1.2)
Total Protein: 8.4 g/dL — ABNORMAL HIGH (ref 6.5–8.1)

## 2023-07-03 LAB — PREGNANCY, URINE: Preg Test, Ur: NEGATIVE

## 2023-07-03 MED ORDER — ACETAMINOPHEN 500 MG PO TABS
1000.0000 mg | ORAL_TABLET | Freq: Once | ORAL | Status: AC
  Administered 2023-07-03: 1000 mg via ORAL
  Filled 2023-07-03: qty 2

## 2023-07-03 MED ORDER — IOHEXOL 350 MG/ML SOLN
100.0000 mL | Freq: Once | INTRAVENOUS | Status: AC | PRN
  Administered 2023-07-03: 100 mL via INTRAVENOUS

## 2023-07-03 MED ORDER — FENTANYL CITRATE PF 50 MCG/ML IJ SOSY
50.0000 ug | PREFILLED_SYRINGE | Freq: Once | INTRAMUSCULAR | Status: AC
  Administered 2023-07-03: 50 ug via INTRAVENOUS
  Filled 2023-07-03: qty 1

## 2023-07-03 MED ORDER — OXYCODONE HCL 5 MG PO TABS: 5.0000 mg | ORAL_TABLET | ORAL | 0 refills | Status: AC | PRN

## 2023-07-03 MED ORDER — FENTANYL CITRATE PF 50 MCG/ML IJ SOSY
100.0000 ug | PREFILLED_SYRINGE | Freq: Once | INTRAMUSCULAR | Status: AC
  Administered 2023-07-03: 100 ug via INTRAVENOUS
  Filled 2023-07-03: qty 2

## 2023-07-03 MED ORDER — LIDOCAINE HCL (PF) 1 % IJ SOLN
30.0000 mL | Freq: Once | INTRAMUSCULAR | Status: AC
  Administered 2023-07-03: 30 mL via INTRADERMAL
  Filled 2023-07-03: qty 30

## 2023-07-03 MED ORDER — IBUPROFEN 800 MG PO TABS: 800.0000 mg | ORAL_TABLET | Freq: Three times a day (TID) | ORAL | 0 refills | Status: AC

## 2023-07-03 MED ORDER — ACETAMINOPHEN 500 MG PO TABS: 1000.0000 mg | ORAL_TABLET | Freq: Three times a day (TID) | ORAL | 0 refills | Status: AC | PRN

## 2023-07-03 MED ORDER — IOHEXOL 350 MG/ML SOLN: 75.0000 mL | Freq: Once | INTRAVENOUS | Status: DC | PRN
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
## 2023-07-03 NOTE — Progress Notes (Signed)
Orthopedic Tech Progress Note Patient Details:  Brandi Leon [birth year] [id number]  Ortho Devices Type of Ortho Device: Sugartong splint Ortho Device/Splint Location: LUE Ortho Device/Splint Interventions: Ordered, Application, Adjustment   Post Interventions Patient Tolerated: Well  Al Decant 07/03/2023, 10:24 PM
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
## 2023-07-13 ENCOUNTER — Ambulatory Visit (HOSPITAL_COMMUNITY): Payer: Medicaid Other | Admitting: Anesthesiology

## 2023-07-13 ENCOUNTER — Ambulatory Visit (HOSPITAL_COMMUNITY)
Admission: AD | Admit: 2023-07-13 | Discharge: 2023-07-13 | Disposition: A | Discharge status: Home / Self Care | Payer: Medicaid Other | Source: Other Acute Inpatient Hospital | Attending: Orthopedic Surgery | Admitting: Orthopedic Surgery

## 2023-07-13 ENCOUNTER — Other Ambulatory Visit: Payer: Self-pay

## 2023-07-13 ENCOUNTER — Encounter (HOSPITAL_COMMUNITY)
Admission: AD | Disposition: A | Discharge status: Home / Self Care | Payer: Self-pay | Source: Other Acute Inpatient Hospital | Attending: Orthopedic Surgery

## 2023-07-13 ENCOUNTER — Encounter (HOSPITAL_COMMUNITY): Payer: Self-pay | Admitting: Orthopedic Surgery

## 2023-07-13 DIAGNOSIS — S52372A Galeazzi's fracture of left radius, initial encounter for closed fracture: Secondary | ICD-10-CM | POA: Diagnosis present

## 2023-07-13 DIAGNOSIS — Z538 Procedure and treatment not carried out for other reasons: Secondary | ICD-10-CM | POA: Insufficient documentation

## 2023-07-13 SURGERY — OPEN REDUCTION INTERNAL FIXATION (ORIF) DISTAL RADIUS FRACTURE
Anesthesia: Monitor Anesthesia Care | Laterality: Left

## 2023-07-13 MED ORDER — CHLORHEXIDINE GLUCONATE 0.12 % MT SOLN
OROMUCOSAL | Status: AC
  Filled 2023-07-13: qty 15

## 2023-07-13 MED ORDER — CEFAZOLIN IN SODIUM CHLORIDE 3-0.9 GM/100ML-% IV SOLN: 3.0000 g | INTRAVENOUS | Status: DC

## 2023-07-13 NOTE — Progress Notes (Signed)
Patient waited until 1900 at which point she decided to be rescheduled because she still was not assigned to an OR due to lack of OR staff. IV removed and patient ambulated out with mother and grandmother.  Dr. Melvyn Novas and OR desk aware.

## 2023-07-13 NOTE — Anesthesia Preprocedure Evaluation (Signed)
Anesthesia Evaluation  Patient identified by MRN, date of birth, ID band Patient awake    Reviewed: Allergy & Precautions, NPO status , Patient's Chart, lab work & pertinent test results  History of Anesthesia Complications Negative for: history of anesthetic complications  Airway Mallampati: III  TM Distance: >3 FB Neck ROM: Full   Comment: Previous grade I view with Glidescope 3, easy mask Dental  (+) Dental Advisory Given   Pulmonary neg pulmonary ROS   Pulmonary exam normal breath sounds clear to auscultation       Cardiovascular negative cardio ROS  Rhythm:Regular Rate:Normal     Neuro/Psych  PSYCHIATRIC DISORDERS Anxiety     negative neurological ROS     GI/Hepatic Neg liver ROS, hiatal hernia,GERD  Controlled,,  Endo/Other  neg diabetesHypothyroidism (not on medication)  Morbid obesity  Renal/GU negative Renal ROS     Musculoskeletal   Abdominal  (+) + obese  Peds  Hematology negative hematology ROS (+)   Anesthesia Other Findings   Reproductive/Obstetrics                              Anesthesia Physical Anesthesia Plan  ASA: 3  Anesthesia Plan: Regional and MAC   Post-op Pain Management: Regional block*   Induction: Intravenous  PONV Risk Score and Plan: 2 and Ondansetron, Dexamethasone and Treatment may vary due to age or medical condition  Airway Management Planned: Natural Airway and Simple Face Mask  Additional Equipment:   Intra-op Plan:   Post-operative Plan:   Informed Consent: I have reviewed the patients History and Physical, chart, labs and discussed the procedure including the risks, benefits and alternatives for the proposed anesthesia with the patient or authorized representative who has indicated his/her understanding and acceptance.     Dental advisory given (Mother and grandmother present)  Plan Discussed with: CRNA and  Anesthesiologist  Anesthesia Plan Comments: (Discussed potential risks of nerve blocks including, but not limited to, infection, bleeding, nerve damage, seizures, pneumothorax, respiratory depression, and potential failure of the block. Alternatives to nerve blocks discussed. All questions answered.  Discussed with patient risks of MAC including, but not limited to, minor pain or discomfort, hearing people in the room, and possible need for backup general anesthesia. Risks for general anesthesia also discussed including, but not limited to, sore throat, hoarse voice, chipped/damaged teeth, injury to vocal cords, nausea and vomiting, allergic reactions, lung infection, heart attack, stroke, and death. All questions answered. )         Anesthesia Quick Evaluation
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
## 2023-07-14 ENCOUNTER — Encounter (HOSPITAL_COMMUNITY): Payer: Self-pay | Admitting: Orthopedic Surgery

## 2023-07-14 NOTE — Progress Notes (Signed)
Spoke with pt for pre-op call. Pt was here yesterday for same surgery but left due to long wait because of shortage in OR rooms.   Shower instructions given to pt.  Pt was instructed to call the OR desk (gave her the #) at 8 AM and ask when for her surgery start time. Instructed her to arrive here 2 hours prior to surgery start time. She voiced understanding.

## 2023-07-15 ENCOUNTER — Encounter (HOSPITAL_COMMUNITY)
Admission: RE | Disposition: A | Discharge status: Home / Self Care | Payer: Self-pay | Source: Home / Self Care | Attending: Orthopedic Surgery

## 2023-07-15 ENCOUNTER — Ambulatory Visit (HOSPITAL_COMMUNITY): Payer: Medicaid Other | Admitting: Anesthesiology

## 2023-07-15 ENCOUNTER — Other Ambulatory Visit: Payer: Self-pay

## 2023-07-15 ENCOUNTER — Ambulatory Visit (HOSPITAL_COMMUNITY): Payer: Medicaid Other

## 2023-07-15 ENCOUNTER — Encounter (HOSPITAL_COMMUNITY): Payer: Self-pay | Admitting: Orthopedic Surgery

## 2023-07-15 ENCOUNTER — Ambulatory Visit (HOSPITAL_BASED_OUTPATIENT_CLINIC_OR_DEPARTMENT_OTHER): Payer: Medicaid Other | Admitting: Anesthesiology

## 2023-07-15 ENCOUNTER — Ambulatory Visit (HOSPITAL_COMMUNITY)
Admission: RE | Admit: 2023-07-15 | Discharge: 2023-07-15 | Disposition: A | Discharge status: Home / Self Care | Payer: Medicaid Other | Attending: Orthopedic Surgery | Admitting: Orthopedic Surgery

## 2023-07-15 DIAGNOSIS — S52372A Galeazzi's fracture of left radius, initial encounter for closed fracture: Secondary | ICD-10-CM | POA: Diagnosis present

## 2023-07-15 DIAGNOSIS — F419 Anxiety disorder, unspecified: Secondary | ICD-10-CM

## 2023-07-15 HISTORY — PX: OPEN REDUCTION INTERNAL FIXATION (ORIF) DISTAL RADIAL FRACTURE: SHX5989

## 2023-07-15 LAB — POCT PREGNANCY, URINE: Preg Test, Ur: NEGATIVE

## 2023-07-15 SURGERY — OPEN REDUCTION INTERNAL FIXATION (ORIF) DISTAL RADIUS FRACTURE
Anesthesia: Regional | Laterality: Left

## 2023-07-15 MED ORDER — ONDANSETRON HCL 4 MG/2ML IJ SOLN
INTRAMUSCULAR | Status: AC
  Filled 2023-07-15: qty 2

## 2023-07-15 MED ORDER — 0.9 % SODIUM CHLORIDE (POUR BTL) OPTIME
TOPICAL | Status: DC | PRN
  Administered 2023-07-15: 1000 mL

## 2023-07-15 MED ORDER — PROPOFOL 10 MG/ML IV BOLUS
INTRAVENOUS | Status: DC | PRN
  Administered 2023-07-15: 250 mg via INTRAVENOUS

## 2023-07-15 MED ORDER — LIDOCAINE 2% (20 MG/ML) 5 ML SYRINGE
INTRAMUSCULAR | Status: DC | PRN
  Administered 2023-07-15: 40 mg via INTRAVENOUS

## 2023-07-15 MED ORDER — MIDAZOLAM HCL 2 MG/2ML IJ SOLN: 2.0000 mg | Freq: Once | INTRAMUSCULAR | Status: AC

## 2023-07-15 MED ORDER — FENTANYL CITRATE (PF) 250 MCG/5ML IJ SOLN
INTRAMUSCULAR | Status: DC | PRN
  Administered 2023-07-15: 50 ug via INTRAVENOUS
  Administered 2023-07-15 (×3): 25 ug via INTRAVENOUS

## 2023-07-15 MED ORDER — BUPIVACAINE-EPINEPHRINE (PF) 0.5% -1:200000 IJ SOLN
INTRAMUSCULAR | Status: DC | PRN
  Administered 2023-07-15: 30 mL via PERINEURAL

## 2023-07-15 MED ORDER — FENTANYL CITRATE (PF) 100 MCG/2ML IJ SOLN
INTRAMUSCULAR | Status: AC
  Administered 2023-07-15: 100 ug via INTRAVENOUS
  Filled 2023-07-15: qty 2

## 2023-07-15 MED ORDER — DEXMEDETOMIDINE HCL IN NACL 80 MCG/20ML IV SOLN
INTRAVENOUS | Status: DC | PRN
  Administered 2023-07-15: 8 ug via INTRAVENOUS
  Administered 2023-07-15: 12 ug via INTRAVENOUS

## 2023-07-15 MED ORDER — FENTANYL CITRATE (PF) 100 MCG/2ML IJ SOLN: 100.0000 ug | Freq: Once | INTRAMUSCULAR | Status: AC

## 2023-07-15 MED ORDER — MEPERIDINE HCL 25 MG/ML IJ SOLN: 6.2500 mg | INTRAMUSCULAR | Status: DC | PRN

## 2023-07-15 MED ORDER — CEFAZOLIN IN SODIUM CHLORIDE 3-0.9 GM/100ML-% IV SOLN
3.0000 g | Freq: Once | INTRAVENOUS | Status: AC
  Administered 2023-07-15: 3 g via INTRAVENOUS
  Filled 2023-07-15: qty 100

## 2023-07-15 MED ORDER — SCOPOLAMINE 1 MG/3DAYS TD PT72
1.0000 | MEDICATED_PATCH | TRANSDERMAL | Status: DC
  Administered 2023-07-15: 1.5 mg via TRANSDERMAL
  Filled 2023-07-15: qty 1

## 2023-07-15 MED ORDER — OXYCODONE HCL 5 MG PO TABS: 5.0000 mg | ORAL_TABLET | Freq: Once | ORAL | Status: DC | PRN

## 2023-07-15 MED ORDER — HYDROMORPHONE HCL 1 MG/ML IJ SOLN: 0.2500 mg | INTRAMUSCULAR | Status: DC | PRN

## 2023-07-15 MED ORDER — OXYCODONE HCL 5 MG/5ML PO SOLN: 5.0000 mg | Freq: Once | ORAL | Status: DC | PRN

## 2023-07-15 MED ORDER — LACTATED RINGERS IV SOLN: INTRAVENOUS | Status: DC

## 2023-07-15 MED ORDER — ONDANSETRON HCL 4 MG/2ML IJ SOLN
INTRAMUSCULAR | Status: DC | PRN
  Administered 2023-07-15: 4 mg via INTRAVENOUS

## 2023-07-15 MED ORDER — CHLORHEXIDINE GLUCONATE 0.12 % MT SOLN
15.0000 mL | Freq: Once | OROMUCOSAL | Status: AC
  Administered 2023-07-15: 15 mL via OROMUCOSAL
  Filled 2023-07-15: qty 15

## 2023-07-15 MED ORDER — DEXAMETHASONE SODIUM PHOSPHATE 10 MG/ML IJ SOLN
INTRAMUSCULAR | Status: AC
  Filled 2023-07-15: qty 1

## 2023-07-15 MED ORDER — ACETAMINOPHEN 500 MG PO TABS
1000.0000 mg | ORAL_TABLET | Freq: Once | ORAL | Status: AC
  Administered 2023-07-15: 1000 mg via ORAL
  Filled 2023-07-15: qty 2

## 2023-07-15 MED ORDER — MIDAZOLAM HCL 2 MG/2ML IJ SOLN
INTRAMUSCULAR | Status: AC
  Administered 2023-07-15: 2 mg via INTRAVENOUS
  Filled 2023-07-15: qty 2

## 2023-07-15 MED ORDER — FENTANYL CITRATE (PF) 250 MCG/5ML IJ SOLN
INTRAMUSCULAR | Status: AC
  Filled 2023-07-15: qty 5

## 2023-07-15 MED ORDER — PROMETHAZINE HCL 25 MG/ML IJ SOLN: 6.2500 mg | INTRAMUSCULAR | Status: DC | PRN

## 2023-07-15 MED ORDER — PROPOFOL 10 MG/ML IV BOLUS
INTRAVENOUS | Status: AC
  Filled 2023-07-15: qty 20

## 2023-07-15 MED ORDER — DEXAMETHASONE SODIUM PHOSPHATE 10 MG/ML IJ SOLN
INTRAMUSCULAR | Status: DC | PRN
  Administered 2023-07-15: 5 mg via INTRAVENOUS

## 2023-07-15 MED ORDER — MIDAZOLAM HCL 2 MG/2ML IJ SOLN: 0.5000 mg | Freq: Once | INTRAMUSCULAR | Status: DC | PRN

## 2023-07-15 MED ORDER — ORAL CARE MOUTH RINSE: 15.0000 mL | Freq: Once | OROMUCOSAL | Status: AC

## 2023-07-15 SURGICAL SUPPLY — 65 items
BAG COUNTER SPONGE SURGICOUNT (BAG) ×1 IMPLANT
BAG SPNG CNTER NS LX DISP (BAG)
BIT DRILL SHORT 2.0 ZI (BIT) IMPLANT
BLADE CLIPPER SURG (BLADE) IMPLANT
BNDG CMPR 5X3 KNIT ELC UNQ LF (GAUZE/BANDAGES/DRESSINGS) ×2
BNDG CMPR 9X4 STRL LF SNTH (GAUZE/BANDAGES/DRESSINGS) ×1
BNDG ELASTIC 3INX 5YD STR LF (GAUZE/BANDAGES/DRESSINGS) ×1 IMPLANT
BNDG ELASTIC 4X5.8 VLCR STR LF (GAUZE/BANDAGES/DRESSINGS) ×1 IMPLANT
BNDG ESMARK 4X9 LF (GAUZE/BANDAGES/DRESSINGS) ×1 IMPLANT
BNDG GAUZE DERMACEA FLUFF 4 (GAUZE/BANDAGES/DRESSINGS) ×1 IMPLANT
BNDG GZE DERMACEA 4 6PLY (GAUZE/BANDAGES/DRESSINGS) ×1
CANISTER SUCT 3000ML PPV (MISCELLANEOUS) ×1 IMPLANT
CORD BIPOLAR FORCEPS 12FT (ELECTRODE) ×1 IMPLANT
COVER SURGICAL LIGHT HANDLE (MISCELLANEOUS) ×1 IMPLANT
CUFF TOURN SGL QUICK 18X4 (TOURNIQUET CUFF) ×1 IMPLANT
CUFF TOURN SGL QUICK 24 (TOURNIQUET CUFF)
CUFF TRNQT CYL 24X4X16.5-23 (TOURNIQUET CUFF) IMPLANT
DRAPE OEC MINIVIEW 54X84 (DRAPES) ×1 IMPLANT
DRAPE SURG 17X11 SM STRL (DRAPES) ×1 IMPLANT
DRSG ADAPTIC 3X8 NADH LF (GAUZE/BANDAGES/DRESSINGS) ×1 IMPLANT
GAUZE 4X4 16PLY ~~LOC~~+RFID DBL (SPONGE) ×1 IMPLANT
GAUZE SPONGE 4X4 12PLY STRL (GAUZE/BANDAGES/DRESSINGS) ×1 IMPLANT
GAUZE XEROFORM 5X9 LF (GAUZE/BANDAGES/DRESSINGS) ×1 IMPLANT
GLOVE BIOGEL PI IND STRL 8.5 (GLOVE) ×1 IMPLANT
GLOVE SURG ORTHO 8.0 STRL STRW (GLOVE) ×1 IMPLANT
GOWN STRL REUS W/ TWL LRG LVL3 (GOWN DISPOSABLE) ×1 IMPLANT
GOWN STRL REUS W/ TWL XL LVL3 (GOWN DISPOSABLE) ×1 IMPLANT
GOWN STRL REUS W/TWL LRG LVL3 (GOWN DISPOSABLE) ×1
GOWN STRL REUS W/TWL XL LVL3 (GOWN DISPOSABLE) ×1
K-WIRE ALPS MXV 1.6X6 ZI (WIRE) ×1
KIT BASIN OR (CUSTOM PROCEDURE TRAY) ×1 IMPLANT
KIT TURNOVER KIT B (KITS) ×1 IMPLANT
KWIRE ALPS MXV 1.6X6 ZI (WIRE) IMPLANT
NDL HYPO 25X1 1.5 SAFETY (NEEDLE) ×1 IMPLANT
NEEDLE HYPO 25X1 1.5 SAFETY (NEEDLE)
NS IRRIG 1000ML POUR BTL (IV SOLUTION) ×1 IMPLANT
PACK ORTHO EXTREMITY (CUSTOM PROCEDURE TRAY) ×1 IMPLANT
PAD ARMBOARD 7.5X6 YLW CONV (MISCELLANEOUS) ×2 IMPLANT
PAD CAST 3X4 CTTN HI CHSV (CAST SUPPLIES) IMPLANT
PAD CAST 4YDX4 CTTN HI CHSV (CAST SUPPLIES) ×1 IMPLANT
PADDING CAST COTTON 3X4 STRL (CAST SUPPLIES) ×2
PADDING CAST COTTON 4X4 STRL (CAST SUPPLIES)
PLATE HIGH STRENG 2.7X20 (Plate) IMPLANT
SCREW LOCK MDS 2.710 (Screw) IMPLANT
SCREW LOCK MDS 2.7X12 (Screw) IMPLANT
SCREW NL 2.7X12 (Screw) IMPLANT
SCREW NLOCK 2.7X10 (Screw) IMPLANT
SCREW NLOCK 2.7X12 (Screw) ×2 IMPLANT
SLING ARM IMMOBILIZER XL (CAST SUPPLIES) IMPLANT
SOAP 2 % CHG 4 OZ (WOUND CARE) ×1 IMPLANT
SPIKE FLUID TRANSFER (MISCELLANEOUS) ×1 IMPLANT
SPLINT FIBERGLASS 3X35 (CAST SUPPLIES) IMPLANT
SPONGE T-LAP 4X18 ~~LOC~~+RFID (SPONGE) ×1 IMPLANT
SUT MNCRL AB 3-0 PS2 27 (SUTURE) IMPLANT
SUT PROLENE 3 0 PS 2 (SUTURE) IMPLANT
SUT PROLENE 4 0 PS 2 18 (SUTURE) IMPLANT
SUT VIC AB 2-0 CT1 27 (SUTURE)
SUT VIC AB 2-0 CT1 TAPERPNT 27 (SUTURE) IMPLANT
SUT VICRYL 4-0 PS2 18IN ABS (SUTURE) IMPLANT
SYR CONTROL 10ML LL (SYRINGE) IMPLANT
TOWEL GREEN STERILE (TOWEL DISPOSABLE) ×1 IMPLANT
TOWEL GREEN STERILE FF (TOWEL DISPOSABLE) IMPLANT
TUBE CONNECTING 12X1/4 (SUCTIONS) ×1 IMPLANT
WATER STERILE IRR 1000ML POUR (IV SOLUTION) ×1 IMPLANT
YANKAUER SUCT BULB TIP NO VENT (SUCTIONS) IMPLANT
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
## 2023-07-15 NOTE — Discharge Instructions (Addendum)
KEEP BANDAGE CLEAN AND DRY CALL OFFICE FOR F/U APPT [phone] Percocet sent to [facility] on [address] KEEP HAND ELEVATED ABOVE HEART OK TO APPLY ICE TO OPERATIVE AREA CONTACT OFFICE IF ANY WORSENING PAIN OR CONCERNS.

## 2023-07-15 NOTE — Anesthesia Procedure Notes (Signed)
Procedure Name: LMA Insertion Date/Time: 07/15/2023 5:06 PM  Performed by: Aundria Rud, CRNAPre-anesthesia Checklist: Patient identified, Emergency Drugs available, Suction available and Patient being monitored Patient Re-evaluated:Patient Re-evaluated prior to induction Oxygen Delivery Method: Circle System Utilized Preoxygenation: Pre-oxygenation with 100% oxygen Induction Type: IV induction Ventilation: Mask ventilation without difficulty LMA: LMA inserted LMA Size: 4.0 Number of attempts: 1 Airway Equipment and Method: Bite block Placement Confirmation: positive ETCO2 Tube secured with: Tape Dental Injury: Teeth and Oropharynx as per pre-operative assessment

## 2023-07-15 NOTE — Transfer of Care (Signed)
Immediate Anesthesia Transfer of Care Note  Patient: Brandi Leon  Procedure(s) Performed: OPEN REDUCTION INTERNAL FIXATION (ORIF) left forearm (Left)  Patient Location: PACU  Anesthesia Type:GA combined with regional for post-op pain  Level of Consciousness: awake, drowsy, and patient cooperative  Airway & Oxygen Therapy: Patient Spontanous Breathing  Post-op Assessment: Report given to RN and Post -op Vital signs reviewed and stable  Post vital signs: Reviewed and stable  Last Vitals:  Vitals Value Taken Time  BP 137/83 07/15/23 1916  Temp    Pulse 94 07/15/23 1922  Resp 23 07/15/23 1922  SpO2 91 % 07/15/23 1922  Vitals shown include unfiled device data.  Last Pain:  Vitals:   07/15/23 1551  TempSrc:   PainSc: 7       Patients Stated Pain Goal: 3 (07/15/23 1551)  Complications: No notable events documented.
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
## 2023-07-15 NOTE — Anesthesia Procedure Notes (Signed)
Anesthesia Regional Block: Supraclavicular block   Pre-Anesthetic Checklist: , timeout performed,  Correct Patient, Correct Site, Correct Laterality,  Correct Procedure, Correct Position, site marked,  Risks and benefits discussed,  Surgical consent,  Pre-op evaluation,  At surgeon's request and post-op pain management  Laterality: Left and Upper  Prep: chloraprep       Needles:  Injection technique: Single-shot  Needle Type: Echogenic Needle     Needle Length: 9cm  Needle Gauge: 21     Additional Needles:   Procedures:,,,, ultrasound used (permanent image in chart),,    Narrative:  Start time: 07/15/2023 4:24 PM End time: 07/15/2023 4:31 PM Injection made incrementally with aspirations every 5 mL.  Performed by: Personally  Anesthesiologist: Jairo Ben, MD  Additional Notes: Pt identified in Holding room.  Monitors applied. Working IV access confirmed. Timeout, Sterile prep L clavicle and neck.  #21ga ECHOgenic Arrow block needle to supraclavicular brachial plexus with US guidance.  30cc 0.5% Bupivacaine 1:200k epi injected incrementally after negative test dose.  Patient asymptomatic, VSS, no heme aspirated, tolerated well.   Sandford Craze, MD

## 2023-07-15 NOTE — Anesthesia Postprocedure Evaluation (Signed)
Anesthesia Post Note  Patient: Brandi Leon  Procedure(s) Performed: OPEN REDUCTION INTERNAL FIXATION (ORIF) left forearm (Left)     Patient location during evaluation: PACU Anesthesia Type: Regional and General Level of consciousness: awake and alert, patient cooperative and oriented Pain management: pain level controlled Vital Signs Assessment: post-procedure vital signs reviewed and stable Respiratory status: spontaneous breathing, nonlabored ventilation and respiratory function stable Cardiovascular status: blood pressure returned to baseline and stable Postop Assessment: no apparent nausea or vomiting, able to ambulate and adequate PO intake Anesthetic complications: no   No notable events documented.  Last Vitals:  Vitals:   07/15/23 1944 07/15/23 1945  BP: 126/64   Pulse: 92 92  Resp: (!) 25 (!) 25  Temp: 36.7 C   SpO2: 97% 97%    Last Pain:  Vitals:   07/15/23 1945  TempSrc:   PainSc: 0-No pain                 Daylan Juhnke,E. Aliany Fiorenza

## 2023-07-15 NOTE — Anesthesia Postprocedure Evaluation (Signed)
Anesthesia Post Note  Patient: Brandi Leon  Procedure(s) Performed: OPEN REDUCTION INTERNAL FIXATION (ORIF) left forearm (Left)     Anesthesia Type: Regional Anesthetic complications: no   No notable events documented.  Last Vitals:  Vitals:   07/15/23 1944 07/15/23 1945  BP: 126/64   Pulse: 92 92  Resp: (!) 25 (!) 25  Temp: 36.7 C   SpO2: 97% 97%    Last Pain:  Vitals:   07/15/23 1945  TempSrc:   PainSc: 0-No pain                 Rayvon Dakin,E. Jimi Schappert

## 2023-07-17 ENCOUNTER — Encounter (HOSPITAL_COMMUNITY): Payer: Self-pay | Admitting: Orthopedic Surgery
